# Patient Record
Sex: Male | Born: 2000 | Race: White | Hispanic: No | Marital: Single | Smoking: Never smoker
Health system: Southern US, Community
[De-identification: ages and names within clinical notes are randomized; demographics above are authoritative.]

---
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
## 2020-01-06 ENCOUNTER — Other Ambulatory Visit: Payer: Self-pay

## 2020-01-06 ENCOUNTER — Ambulatory Visit (INDEPENDENT_AMBULATORY_CARE_PROVIDER_SITE_OTHER): Payer: BC Managed Care – PPO

## 2020-01-06 ENCOUNTER — Ambulatory Visit (HOSPITAL_COMMUNITY)
Admission: EM | Admit: 2020-01-06 | Discharge: 2020-01-06 | Disposition: A | Discharge status: Home / Self Care | Payer: BC Managed Care – PPO | Attending: Family Medicine | Admitting: Family Medicine

## 2020-01-06 ENCOUNTER — Encounter (HOSPITAL_COMMUNITY): Payer: Self-pay

## 2020-01-06 DIAGNOSIS — K625 Hemorrhage of anus and rectum: Secondary | ICD-10-CM

## 2020-01-06 DIAGNOSIS — M546 Pain in thoracic spine: Secondary | ICD-10-CM | POA: Diagnosis not present

## 2020-01-06 DIAGNOSIS — K5900 Constipation, unspecified: Secondary | ICD-10-CM | POA: Diagnosis not present

## 2020-01-06 DIAGNOSIS — R195 Other fecal abnormalities: Secondary | ICD-10-CM

## 2020-01-06 NOTE — Discharge Instructions (Addendum)
Your x-ray did not show anything concerning. Recommend stool softener over-the-counter and MiraLAX as needed for constipation. A diet high in fiber with fresh fruits and veggies and increase water intake can help You can do ibuprofen 600 mg every 8 hours for back pain as needed. Heat to the back Follow up as needed for continued or worsening symptoms

## 2020-01-06 NOTE — ED Provider Notes (Signed)
[facility]    CSN: [id number] Arrival date & time: 01/06/20  4098      History   Chief Complaint Chief Complaint  Patient presents with  . Back Pain  . Rectal Bleeding    HPI Cody Ho is a 19 y.o. male.   Patient is a 19 year old male presents today with lower back pain and bloody stools x1 month.  Symptoms have been waxing waning.  Back pain worse at night.  Does not have lifting at work.  Ibuprofen relieves some of his pain.  Describes rectal bleeding is bright red and when he wipes.  He has had some constipation and straining with stools.  No abdominal pain, nausea, vomiting     History reviewed. No pertinent past medical history.  There are no problems to display for this patient.   History reviewed. No pertinent surgical history.     Home Medications    Prior to Admission medications   Not on File    Family History History reviewed. No pertinent family history.  Social History Social History   Tobacco Use  . Smoking status: Never Smoker  . Smokeless tobacco: Never Used  Substance Use Topics  . Alcohol use: Never  . Drug use: Never     Allergies   Patient has no known allergies.   Review of Systems Review of Systems   Physical Exam Triage Vital Signs ED Triage Vitals  Enc Vitals Group     BP 01/06/20 1033 133/84     Pulse Rate 01/06/20 1033 (!) 58     Resp 01/06/20 1033 16     Temp 01/06/20 1033 97.8 F (36.6 C)     Temp Source 01/06/20 1033 Oral     SpO2 01/06/20 1033 100 %     Weight --      Height --      Head Circumference --      Peak Flow --      Pain Score 01/06/20 1030 6     Pain Loc --      Pain Edu? --      Excl. in GC? --    No data found.  Updated Vital Signs BP 133/84 (BP Location: Right Arm)   Pulse (!) 58   Temp 97.8 F (36.6 C) (Oral)   Resp 16   SpO2 100%   Visual Acuity Right Eye Distance:   Left Eye Distance:   Bilateral Distance:    Right Eye Near:   Left Eye Near:      Bilateral Near:     Physical Exam Vitals and nursing note reviewed.  Constitutional:      Appearance: Normal appearance.  HENT:     Head: Normocephalic and atraumatic.     Nose: Nose normal.  Eyes:     Conjunctiva/sclera: Conjunctivae normal.  Pulmonary:     Effort: Pulmonary effort is normal.  Abdominal:     Palpations: Abdomen is soft.     Tenderness: There is no abdominal tenderness.  Musculoskeletal:        General: Normal range of motion.     Cervical back: Normal range of motion.  Skin:    General: Skin is warm and dry.  Neurological:     Mental Status: He is alert.  Psychiatric:        Mood and Affect: Mood normal.      UC Treatments / Results  Labs (all labs ordered are listed, but only abnormal results are displayed) Labs Reviewed -  No data to display  EKG   Radiology DG Abd 1 View  Result Date: 01/06/2020 CLINICAL DATA:  19 year old male with back pain for 1 month, and blood in stool. EXAM: ABDOMEN - 1 VIEW COMPARISON:  None. FINDINGS: Supine AP view at 1149 hours. There is a hypoplastic rib on the left at L1, otherwise normal lumbar segmentation. No osseous abnormality identified. Visible abdominal and pelvic visceral contours appear normal. There is mild retained stool in the rectum and also the right colon. Tiny left hemipelvis phlebolith is incidental. IMPRESSION: Negative. Electronically Signed   By: Odessa Fleming M.D.   On: 01/06/2020 12:06    Procedures Procedures (including critical care time)  Medications Ordered in UC Medications - No data to display  Initial Impression / Assessment and Plan / UC Course  I have reviewed the triage vital signs and the nursing notes.  Pertinent labs & imaging results that were available during my care of the patient were reviewed by me and considered in my medical decision making (see chart for details).     Constipation and recta; bleeding.  Recommended MiraLAX and increase water, high-fiber with fruits and  veggies X-ray with mild stool burden.  Back pain Most likely lumbar strain.  Recommend ibuprofen every 8 hours as needed and heat to the back. Follow up as needed for continued or worsening symptoms   Final Clinical Impressions(s) / UC Diagnoses   Final diagnoses:  Constipation, unspecified constipation type  Rectal bleeding     Discharge Instructions     Your x-ray did not show anything concerning. Recommend stool softener over-the-counter and MiraLAX as needed for constipation. A diet high in fiber with fresh fruits and veggies and increase water intake can help You can do ibuprofen 600 mg every 8 hours for back pain as needed. Heat to the back Follow up as needed for continued or worsening symptoms     ED Prescriptions    None     PDMP not reviewed this encounter.   Janace Aris, NP 01/08/20 [phone]

## 2020-01-06 NOTE — ED Triage Notes (Signed)
Pt presents with middle back pain and blood in stools x 1 month. States back pain is worse at night. Ibuprofen gives some relieve to the pain.

## 2020-06-08 ENCOUNTER — Emergency Department (HOSPITAL_COMMUNITY): Payer: BC Managed Care – PPO

## 2020-06-08 ENCOUNTER — Observation Stay (HOSPITAL_COMMUNITY)
Admission: EM | Admit: 2020-06-08 | Discharge: 2020-06-08 | Disposition: A | Discharge status: Home / Self Care | Payer: BC Managed Care – PPO | Attending: Neurosurgery | Admitting: Neurosurgery

## 2020-06-08 DIAGNOSIS — Z23 Encounter for immunization: Secondary | ICD-10-CM | POA: Insufficient documentation

## 2020-06-08 DIAGNOSIS — S0291XB Unspecified fracture of skull, initial encounter for open fracture: Secondary | ICD-10-CM | POA: Insufficient documentation

## 2020-06-08 DIAGNOSIS — X58XXXA Exposure to other specified factors, initial encounter: Secondary | ICD-10-CM | POA: Diagnosis not present

## 2020-06-08 DIAGNOSIS — Z20822 Contact with and (suspected) exposure to covid-19: Secondary | ICD-10-CM | POA: Diagnosis not present

## 2020-06-08 DIAGNOSIS — S065X9A Traumatic subdural hemorrhage with loss of consciousness of unspecified duration, initial encounter: Secondary | ICD-10-CM

## 2020-06-08 DIAGNOSIS — S0990XA Unspecified injury of head, initial encounter: Secondary | ICD-10-CM

## 2020-06-08 DIAGNOSIS — S065XAA Traumatic subdural hemorrhage with loss of consciousness status unknown, initial encounter: Secondary | ICD-10-CM | POA: Diagnosis present

## 2020-06-08 DIAGNOSIS — S065X1A Traumatic subdural hemorrhage with loss of consciousness of 30 minutes or less, initial encounter: Principal | ICD-10-CM | POA: Insufficient documentation

## 2020-06-08 LAB — CBC WITH DIFFERENTIAL/PLATELET
Abs Immature Granulocytes: 0.05 10*3/uL (ref 0.00–0.07)
Basophils Absolute: 0.1 10*3/uL (ref 0.0–0.1)
Basophils Relative: 1 %
Eosinophils Absolute: 0.1 10*3/uL (ref 0.0–0.5)
Eosinophils Relative: 1 %
HCT: 44.7 % (ref 39.0–52.0)
Hemoglobin: 14.3 g/dL (ref 13.0–17.0)
Immature Granulocytes: 0 %
Lymphocytes Relative: 18 %
Lymphs Abs: 2.4 10*3/uL (ref 0.7–4.0)
MCH: 27.7 pg (ref 26.0–34.0)
MCHC: 32 g/dL (ref 30.0–36.0)
MCV: 86.5 fL (ref 80.0–100.0)
Monocytes Absolute: 1 10*3/uL (ref 0.1–1.0)
Monocytes Relative: 7 %
Neutro Abs: 9.7 10*3/uL — ABNORMAL HIGH (ref 1.7–7.7)
Neutrophils Relative %: 73 %
Platelets: 286 10*3/uL (ref 150–400)
RBC: 5.17 MIL/uL (ref 4.22–5.81)
RDW: 14 % (ref 11.5–15.5)
WBC: 13.4 10*3/uL — ABNORMAL HIGH (ref 4.0–10.5)
nRBC: 0 % (ref 0.0–0.2)

## 2020-06-08 LAB — COMPREHENSIVE METABOLIC PANEL
ALT: 8 U/L (ref 0–44)
AST: 38 U/L (ref 15–41)
Albumin: 3.9 g/dL (ref 3.5–5.0)
Alkaline Phosphatase: 71 U/L (ref 38–126)
Anion gap: 12 (ref 5–15)
BUN: 15 mg/dL (ref 6–20)
CO2: 19 mmol/L — ABNORMAL LOW (ref 22–32)
Calcium: 8.8 mg/dL — ABNORMAL LOW (ref 8.9–10.3)
Chloride: 105 mmol/L (ref 98–111)
Creatinine, Ser: 0.97 mg/dL (ref 0.61–1.24)
GFR, Estimated: >60 mL/min (ref >60)
Glucose, Bld: 129 mg/dL — ABNORMAL HIGH (ref 70–99)
Potassium: 4.4 mmol/L (ref 3.5–5.1)
Sodium: 136 mmol/L (ref 135–145)
Total Bilirubin: 1.4 mg/dL — ABNORMAL HIGH (ref 0.3–1.2)
Total Protein: 6.8 g/dL (ref 6.5–8.1)

## 2020-06-08 LAB — ETHANOL: Alcohol, Ethyl (B): <10 mg/dL (ref <10)

## 2020-06-08 LAB — RESP PANEL BY RT-PCR (FLU A&B, COVID) ARPGX2
Influenza A by PCR: NEGATIVE
Influenza B by PCR: NEGATIVE
SARS Coronavirus 2 by RT PCR: NEGATIVE

## 2020-06-08 MED ORDER — HYDROCODONE-ACETAMINOPHEN 5-325 MG PO TABS: 1.0000 | ORAL_TABLET | ORAL | Status: DC | PRN

## 2020-06-08 MED ORDER — HYDROCODONE-ACETAMINOPHEN 5-325 MG PO TABS: 1.0000 | ORAL_TABLET | ORAL | 0 refills | Status: DC | PRN

## 2020-06-08 MED ORDER — LIDOCAINE-EPINEPHRINE 1 %-1:100000 IJ SOLN
10.0000 mL | Freq: Once | INTRAMUSCULAR | Status: AC
  Administered 2020-06-08: 10 mL
  Filled 2020-06-08: qty 1

## 2020-06-08 MED ORDER — CEPHALEXIN 500 MG PO CAPS: 500.0000 mg | ORAL_CAPSULE | Freq: Four times a day (QID) | ORAL | 0 refills | Status: AC

## 2020-06-08 MED ORDER — CEFAZOLIN SODIUM-DEXTROSE 2-4 GM/100ML-% IV SOLN
2.0000 g | Freq: Once | INTRAVENOUS | Status: AC
  Administered 2020-06-08: 2 g via INTRAVENOUS
  Filled 2020-06-08: qty 100

## 2020-06-08 MED ORDER — TETANUS-DIPHTH-ACELL PERTUSSIS 5-2.5-18.5 LF-MCG/0.5 IM SUSY
0.5000 mL | PREFILLED_SYRINGE | Freq: Once | INTRAMUSCULAR | Status: AC
  Administered 2020-06-08: 0.5 mL via INTRAMUSCULAR
  Filled 2020-06-08: qty 0.5

## 2020-06-08 MED ORDER — ONDANSETRON HCL 4 MG PO TABS: 4.0000 mg | ORAL_TABLET | Freq: Four times a day (QID) | ORAL | Status: DC | PRN

## 2020-06-08 MED ORDER — ACETAMINOPHEN 325 MG PO TABS: 650.0000 mg | ORAL_TABLET | Freq: Four times a day (QID) | ORAL | Status: DC | PRN

## 2020-06-08 MED ORDER — ONDANSETRON HCL 4 MG/2ML IJ SOLN: 4.0000 mg | Freq: Four times a day (QID) | INTRAMUSCULAR | Status: DC | PRN

## 2020-06-08 MED ORDER — MORPHINE SULFATE (PF) 2 MG/ML IV SOLN: 2.0000 mg | INTRAVENOUS | Status: DC | PRN

## 2020-06-08 MED ORDER — POLYETHYLENE GLYCOL 3350 17 G PO PACK: 17.0000 g | PACK | Freq: Every day | ORAL | Status: DC | PRN

## 2020-06-08 MED ORDER — ACETAMINOPHEN 650 MG RE SUPP: 650.0000 mg | Freq: Four times a day (QID) | RECTAL | Status: DC | PRN

## 2020-06-08 NOTE — Discharge Instructions (Signed)
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
## 2020-09-24 ENCOUNTER — Other Ambulatory Visit: Payer: Self-pay

## 2020-09-24 ENCOUNTER — Encounter (HOSPITAL_COMMUNITY): Payer: Self-pay | Admitting: Emergency Medicine

## 2020-09-24 ENCOUNTER — Ambulatory Visit (HOSPITAL_COMMUNITY)
Admission: EM | Admit: 2020-09-24 | Discharge: 2020-09-24 | Disposition: A | Discharge status: Home / Self Care | Payer: BC Managed Care – PPO | Attending: Emergency Medicine | Admitting: Emergency Medicine

## 2020-09-24 DIAGNOSIS — Z113 Encounter for screening for infections with a predominantly sexual mode of transmission: Secondary | ICD-10-CM

## 2020-09-24 DIAGNOSIS — R3121 Asymptomatic microscopic hematuria: Secondary | ICD-10-CM | POA: Diagnosis not present

## 2020-09-24 DIAGNOSIS — R300 Dysuria: Secondary | ICD-10-CM | POA: Diagnosis not present

## 2020-09-24 LAB — POCT URINALYSIS DIPSTICK, ED / UC
Bilirubin Urine: NEGATIVE
Glucose, UA: NEGATIVE mg/dL
Ketones, ur: NEGATIVE mg/dL
Leukocytes,Ua: NEGATIVE
Nitrite: NEGATIVE
Protein, ur: NEGATIVE mg/dL
Specific Gravity, Urine: 1.025 (ref 1.005–1.030)
Urobilinogen, UA: 0.2 mg/dL (ref 0.0–1.0)
pH: 6 (ref 5.0–8.0)

## 2020-09-24 MED ORDER — SULFAMETHOXAZOLE-TRIMETHOPRIM 800-160 MG PO TABS: 1.0000 | ORAL_TABLET | Freq: Two times a day (BID) | ORAL | 0 refills | Status: AC

## 2020-09-24 MED ORDER — LIDOCAINE HCL (PF) 1 % IJ SOLN
INTRAMUSCULAR | Status: AC
  Filled 2020-09-24: qty 2

## 2020-09-24 MED ORDER — CEFTRIAXONE SODIUM 1 G IJ SOLR
1.0000 g | Freq: Once | INTRAMUSCULAR | Status: AC
  Administered 2020-09-24: 1 g via INTRAMUSCULAR

## 2020-09-24 MED ORDER — CEFTRIAXONE SODIUM 1 G IJ SOLR
INTRAMUSCULAR | Status: AC
  Filled 2020-09-24: qty 10

## 2020-09-24 NOTE — Discharge Instructions (Addendum)
Urine culture sent.  We will call you with the results.   Push fluids and get plenty of rest.  Rocephin 1 g IM was given in office Bactrim DS was prescribed Take antibiotic as directed and to completion Follow up with PCP if symptoms persists Return here or go to ER if you have any new or worsening symptoms such as fever, worsening abdominal pain, nausea/vomiting, flank pain, etc..

## 2020-09-24 NOTE — ED Provider Notes (Signed)
[facility]   Chief Complaint  Patient presents with  . Hematuria  . Dysuria     SUBJECTIVE:  Cody Ho is a 20 y.o. male who presented to the urgent care with a complaint of hematuria and dysuria for the past 1 day.  Patient denies a precipitating event, recent sexual encounter, excessive caffeine intake.  Report dysuria as constant..  Has tried OTC medications without relief.  Symptoms are made worse with urination.  Admits to similar symptoms in the past.  Denies fever, chills, nausea, vomiting, abdominal pain, flank pain, abnormal penile discharge or bleeding.  LMP: No LMP for male patient.  ROS: As in HPI.  All other pertinent ROS negative.     History reviewed. No pertinent past medical history. History reviewed. No pertinent surgical history. No Known Allergies No current facility-administered medications on file prior to encounter.   Current Outpatient Medications on File Prior to Encounter  Medication Sig Dispense Refill  . Cyanocobalamin (VITAMIN B12 PO) Take 1 tablet by mouth daily.    Marland Kitchen HYDROcodone-acetaminophen (NORCO) 5-325 MG tablet Take 1 tablet by mouth every 4 (four) hours as needed for moderate pain. 6 tablet 0  . Multiple Vitamin (MULTIVITAMIN WITH MINERALS) TABS tablet Take 1 tablet by mouth daily.    . Multiple Vitamins-Minerals (ZINC PO) Take 1 tablet by mouth daily.     Social History   Socioeconomic History  . Marital status: Single    Spouse name: Not on file  . Number of children: Not on file  . Years of education: Not on file  . Highest education level: Not on file  Occupational History  . Not on file  Tobacco Use  . Smoking status: Never Smoker  . Smokeless tobacco: Never Used  Substance and Sexual Activity  . Alcohol use: Never  . Drug use: Never  . Sexual activity: Not on file  Other Topics Concern  . Not on file  Social History Narrative  . Not on file   Social Determinants of Health   Financial Resource Strain: Not  on file  Food Insecurity: Not on file  Transportation Needs: Not on file  Physical Activity: Not on file  Stress: Not on file  Social Connections: Not on file  Intimate Partner Violence: Not on file   History reviewed. No pertinent family history.  OBJECTIVE:  Vitals:   09/24/20 1130  BP: (!) 145/90  Pulse: 67  Resp: 17  Temp: 98.1 F (36.7 C)  TempSrc: Oral  SpO2: 95%   General appearance: AOx3 in no acute distress HEENT: NCAT.  Oropharynx clear.  Lungs: clear to auscultation bilaterally without adventitious breath sounds Heart: regular rate and rhythm.  Radial pulses 2+ symmetrical bilaterally Abdomen: soft; non-distended; no tenderness; bowel sounds present; no guarding or rebound tenderness Back: no CVA tenderness Extremities: no edema; symmetrical with no gross deformities Skin: warm and dry Neurologic: Ambulates from chair to exam table without difficulty Psychological: alert and cooperative; normal mood and affect  Labs Reviewed  POCT URINALYSIS DIPSTICK, ED / UC - Abnormal; Notable for the following components:      Result Value   Hgb urine dipstick LARGE (*)    All other components within normal limits  URINE CULTURE  CYTOLOGY, (ORAL, ANAL, URETHRAL) ANCILLARY ONLY    ASSESSMENT & PLAN:  1. Screening for STD (sexually transmitted disease)   2. Asymptomatic microscopic hematuria   3. Dysuria     Meds ordered this encounter  Medications  . sulfamethoxazole-trimethoprim (BACTRIM  DS) 800-160 MG tablet    Sig: Take 1 tablet by mouth 2 (two) times daily for 7 days.    Dispense:  14 tablet    Refill:  0  . cefTRIAXone (ROCEPHIN) injection 1 g   Discharge instructions  Urine culture sent.  We will call you with the results.   Push fluids and get plenty of rest.  Rocephin 1 g IM was given in office Bactrim DS was prescribed Take antibiotic as directed and to completion Follow up with PCP if symptoms persists Return here or go to ER if you have any new  or worsening symptoms such as fever, worsening abdominal pain, nausea/vomiting, flank pain, etc...  Outlined signs and symptoms indicating need for more acute intervention. Patient verbalized understanding. After Visit Summary given.     Durward Parcel, FNP 09/24/20 1229

## 2020-09-24 NOTE — ED Triage Notes (Signed)
Pt is present today with hematuria and dysuria. Pt states that he noticed his sx last night. Pt states that he also has a decrease output

## 2020-09-25 LAB — URINE CULTURE: Culture: NO GROWTH

## 2020-09-26 LAB — CYTOLOGY, (ORAL, ANAL, URETHRAL) ANCILLARY ONLY
Chlamydia: NEGATIVE
Comment: NEGATIVE
Comment: NEGATIVE
Comment: NORMAL
Neisseria Gonorrhea: NEGATIVE
Trichomonas: NEGATIVE
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
## 2022-06-30 ENCOUNTER — Encounter (HOSPITAL_COMMUNITY): Payer: Self-pay

## 2022-06-30 ENCOUNTER — Ambulatory Visit (HOSPITAL_COMMUNITY)
Admission: EM | Admit: 2022-06-30 | Discharge: 2022-06-30 | Disposition: A | Discharge status: Home / Self Care | Payer: BC Managed Care – PPO | Attending: Family Medicine | Admitting: Family Medicine

## 2022-06-30 DIAGNOSIS — J029 Acute pharyngitis, unspecified: Secondary | ICD-10-CM | POA: Diagnosis not present

## 2022-06-30 DIAGNOSIS — K1379 Other lesions of oral mucosa: Secondary | ICD-10-CM | POA: Diagnosis not present

## 2022-06-30 LAB — POCT RAPID STREP A, ED / UC: Streptococcus, Group A Screen (Direct): NEGATIVE

## 2022-06-30 MED ORDER — CLOTRIMAZOLE 10 MG MT TROC: 10.0000 mg | Freq: Every day | OROMUCOSAL | 0 refills | Status: AC

## 2022-06-30 NOTE — ED Triage Notes (Signed)
Pt is here for possible allergic reaction to a mouth wash that he used. X4 days  . Pt states he is recovering from Covid but, still has a sore throat and cough

## 2022-07-02 LAB — CULTURE, GROUP A STREP (THRC)

## 2022-07-02 NOTE — ED Provider Notes (Signed)
[facility]   [id number] 06/30/22 Arrival Time: [place] PLAN:  1. Sore throat   2. Oral pain     No signs of peritonsillar abscess. Discussed. Rapid strep negative. Culture sent. Possible thrush. Trial of: Meds ordered this encounter  Medications   clotrimazole (MYCELEX) 10 MG troche    Sig: Take 1 tablet (10 mg total) by mouth 5 (five) times daily.    Dispense:  70 tablet    Refill:  0    Results for orders placed or performed during the hospital encounter of 06/30/22  Culture, group A strep (throat)   Specimen: Throat  Result Value Ref Range   Specimen Description THROAT    Special Requests NONE    Culture      TOO YOUNG TO READ Performed at [facility] [address], [place], [place] [address]    Report Status PENDING   POCT Rapid Strep A  Result Value Ref Range   Streptococcus, Group A Screen (Direct) NEGATIVE NEGATIVE   Labs Reviewed  CULTURE, GROUP A STREP [facility])  POCT RAPID STREP A, ED / UC    OTC analgesics and throat care as needed  Reviewed expectations re: course of current medical issues. Questions answered. Outlined signs and symptoms indicating need for more acute intervention. Patient verbalized understanding. After Visit Summary given.   SUBJECTIVE:  Cody Ho is a 22 y.o. male who reports a sore throat. Describes as "tongue burning and my throat hurts.". Onset gradual beginning  3-4 d ago; also used new mouthwash and ques if this has exacerbated symptoms . Symptoms have stabilized since beginning; without voice changes. No respiratory symptoms. Normal PO intake but reports discomfort with swallowing. No specific alleviating factors. Fever: absent. No neck pain or swelling. No associated nausea, vomiting, or abdominal pain. Known sick contacts: none. Recent travel: none. COVID 2 w ago..   OBJECTIVE:  Vitals:   06/30/22 1540  BP: 131/83  Pulse: 84  Resp: 16  Temp: 99.2 F (37.3 C)  TempSrc: Oral   SpO2: 97%     General appearance: alert; no distress HEENT: throat and cheeks with mild but diffuse erythema; slight whitish coating over cheeks and tongue; uvula is midline Neck: supple with FROM; no lymphadenopathy Lungs: speaks full sentences without difficulty; unlabored Abd: soft; non-tender Skin: reveals no rash; warm and dry Psychological: alert and cooperative; normal mood and affect  No Known Allergies  History reviewed. No pertinent past medical history. Social History   Socioeconomic History   Marital status: Single    Spouse name: Not on file   Number of children: Not on file   Years of education: Not on file   Highest education level: Not on file  Occupational History   Not on file  Tobacco Use   Smoking status: Never   Smokeless tobacco: Never  Substance and Sexual Activity   Alcohol use: Never   Drug use: Never   Sexual activity: Not on file  Other Topics Concern   Not on file  Social History Narrative   Not on file   Social Determinants of Health   Financial Resource Strain: Not on file  Food Insecurity: Not on file  Transportation Needs: Not on file  Physical Activity: Not on file  Stress: Not on file  Social Connections: Not on file  Intimate Partner Violence: Not on file   History reviewed. No pertinent family history.         Vanessa Kick, MD  07/02/22 0929  

## 2023-05-17 DIAGNOSIS — E6609 Other obesity due to excess calories: Secondary | ICD-10-CM | POA: Diagnosis not present

## 2023-05-17 DIAGNOSIS — Z1331 Encounter for screening for depression: Secondary | ICD-10-CM | POA: Diagnosis not present

## 2023-05-17 DIAGNOSIS — Z6836 Body mass index (BMI) 36.0-36.9, adult: Secondary | ICD-10-CM | POA: Diagnosis not present

## 2023-05-17 DIAGNOSIS — Z0001 Encounter for general adult medical examination with abnormal findings: Secondary | ICD-10-CM | POA: Diagnosis not present

## 2023-09-17 DIAGNOSIS — E6609 Other obesity due to excess calories: Secondary | ICD-10-CM | POA: Diagnosis not present

## 2023-09-17 DIAGNOSIS — Z6838 Body mass index (BMI) 38.0-38.9, adult: Secondary | ICD-10-CM | POA: Diagnosis not present

## 2023-09-17 DIAGNOSIS — K219 Gastro-esophageal reflux disease without esophagitis: Secondary | ICD-10-CM | POA: Diagnosis not present
